# Patient Record
Sex: Male | Born: 2000 | Race: White | Hispanic: Yes | Marital: Single
Health system: Southern US, Community
[De-identification: ages and names within clinical notes are randomized; demographics above are authoritative.]

---
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
## 2020-07-31 IMAGING — US US SCROTUM W/ DOPPLER COMPLETE
1 series · 14 of 25 positions shown · non-contrast
Comparison: None.

CLINICAL DATA: Left-sided scrotal mass for 3 months.

EXAM:
SCROTAL ULTRASOUND
DOPPLER ULTRASOUND OF THE TESTICLES
TECHNIQUE: Complete ultrasound examination of the testicles, epididymis, and
other scrotal structures was performed. Color and spectral Doppler
ultrasound were also utilized to evaluate blood flow to the
testicles.

[Series 1: us scrotum w/ doppler complete · 14 of 66 slices shown]
[im 1/66]
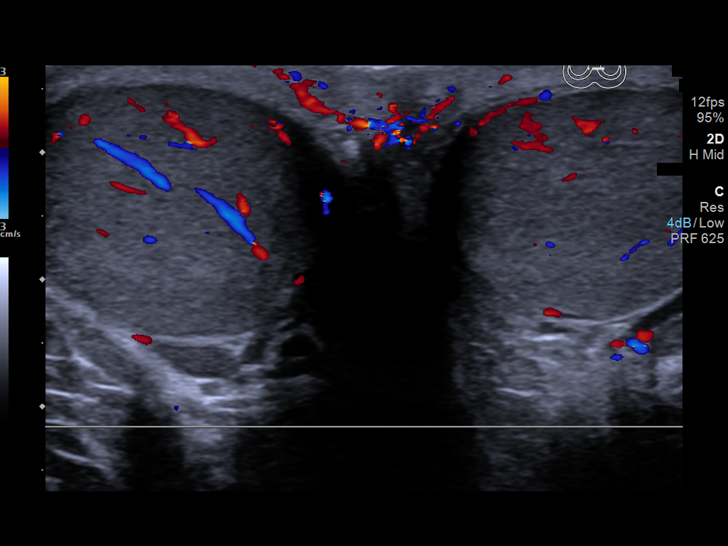
[im 6/66]
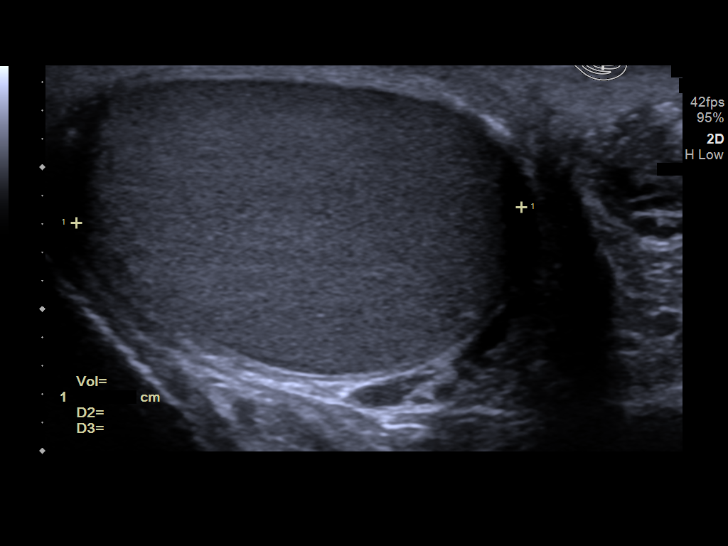
[im 11/66]
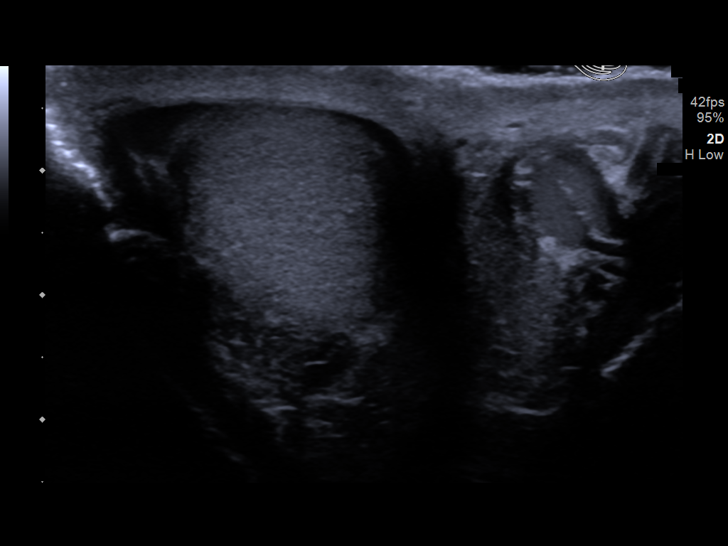
[im 17/66]
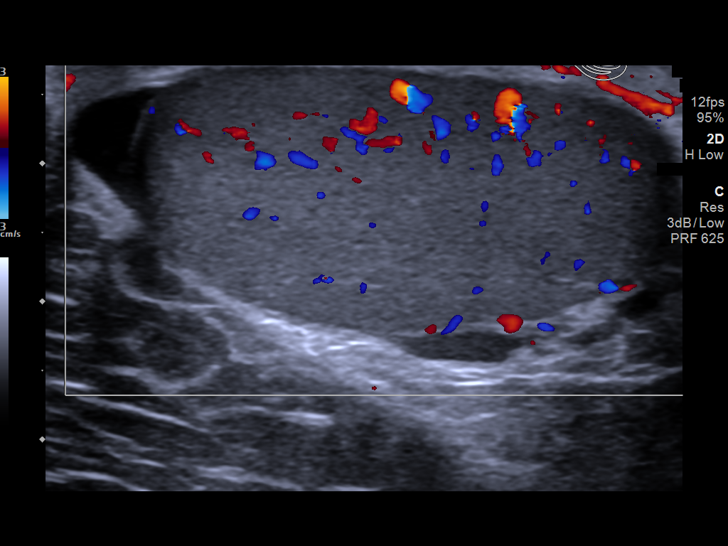
[im 22/66]
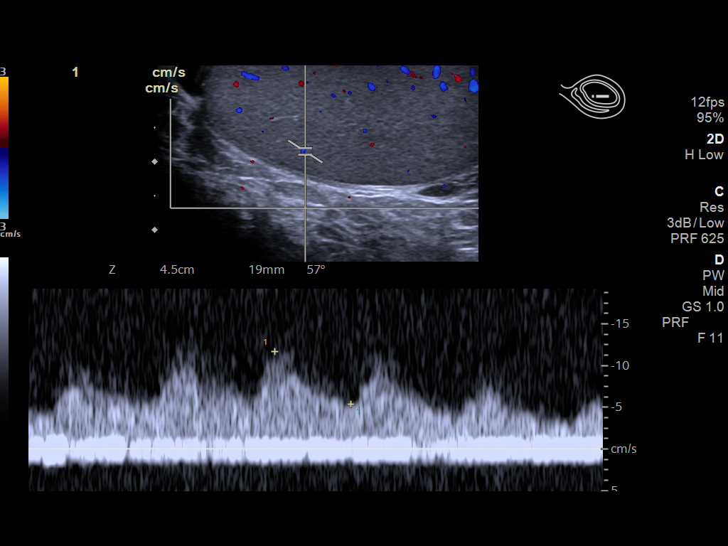
[im 25/66]
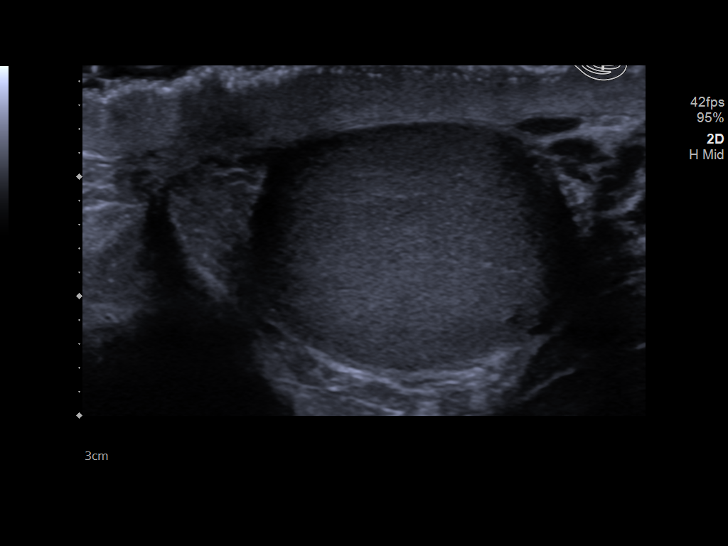
[im 30/66]
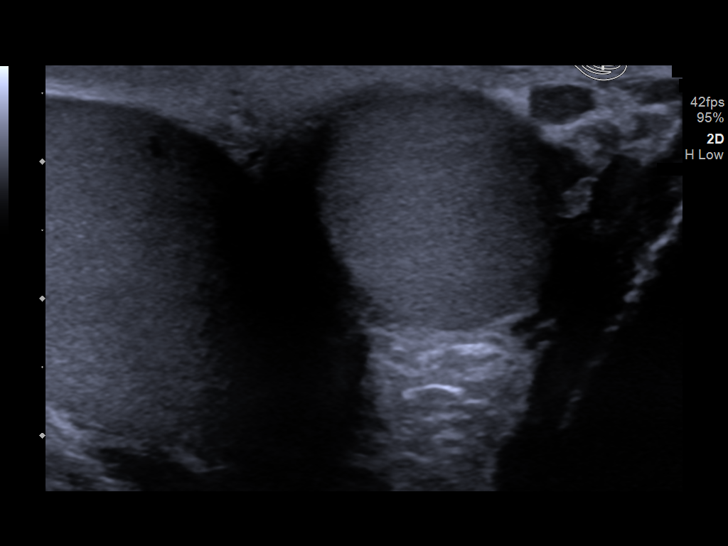
[im 36/66]
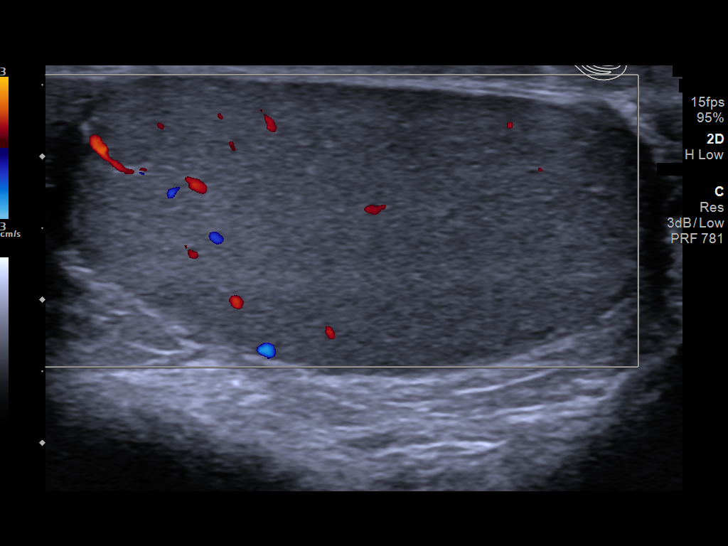
[im 41/66]
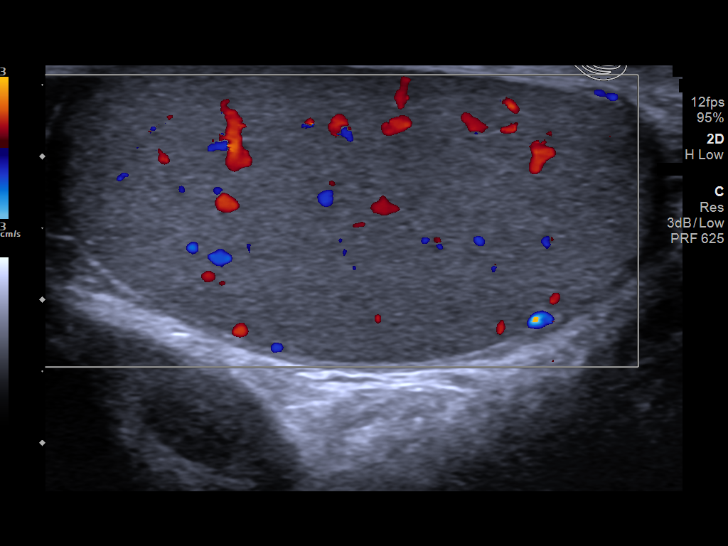
[im 44/66]
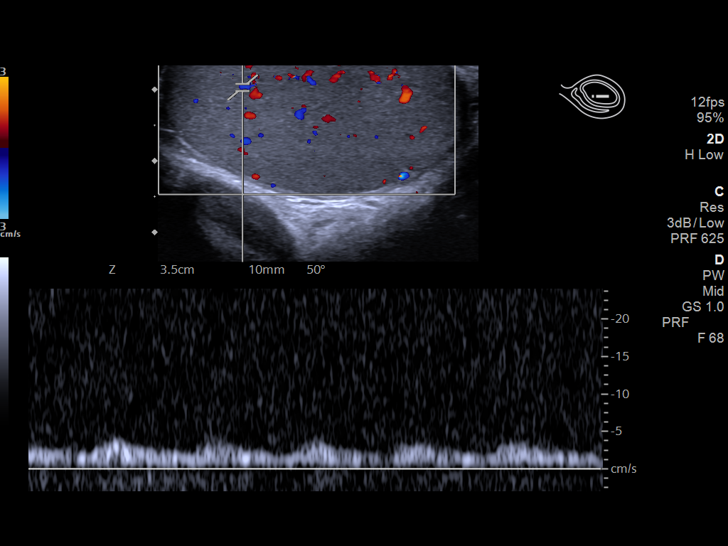
[im 49/66]
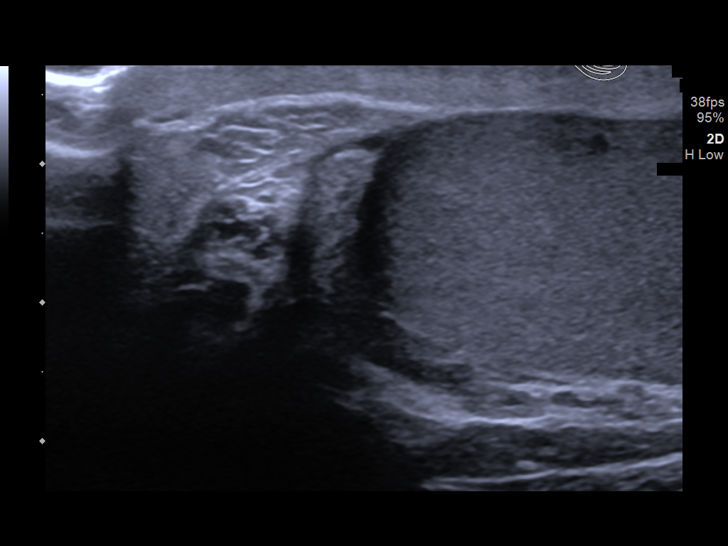
[im 55/66]
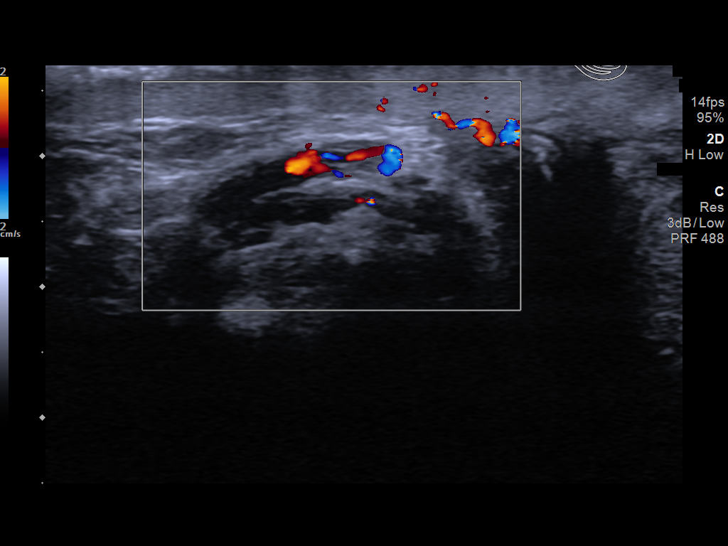
[im 60/66]
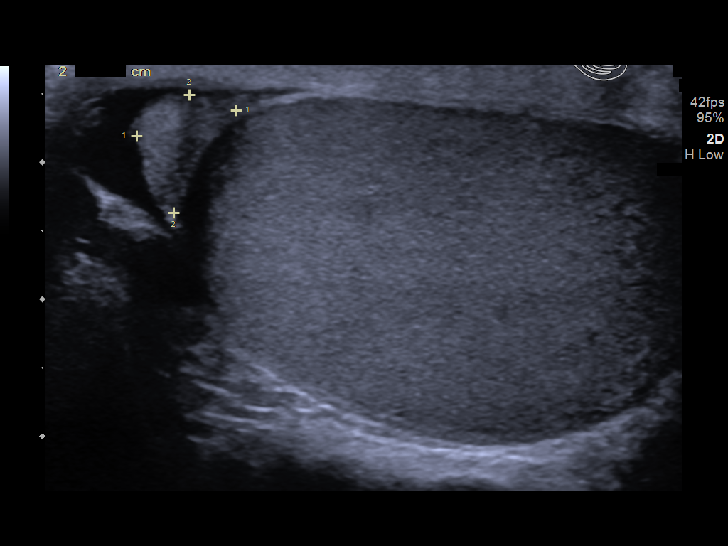
[im 66/66]
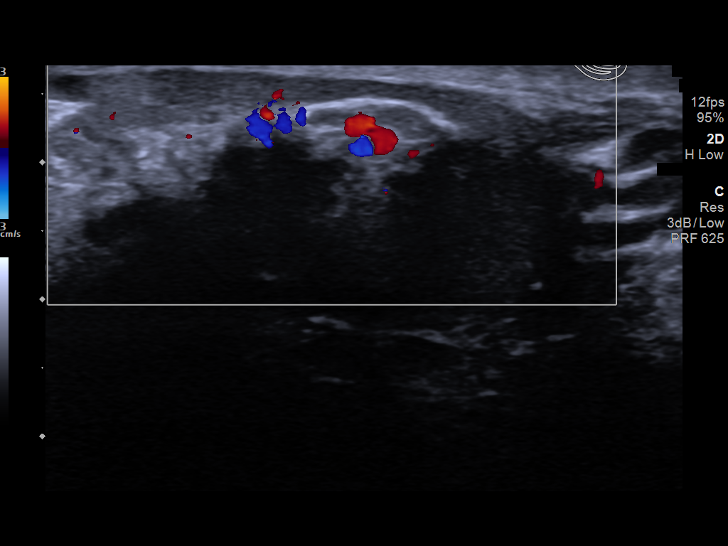

[14 of 25 positions shown; findings below may reference images not displayed]

FINDINGS: Right testicle

Measurements: 43 x 31 x 20 mm.  No mass or abnormal vascularity.

Left testicle

Measurements:  42 x 22 x 28 mm.  No mass or abnormal vascularity.

Right epididymis:  Normal in size and appearance.

Left epididymis:  Normal in size and appearance.

Hydrocele:  None visualized.

Varicocele:  Present on the left with veins dilating to 4 mm.

Pulsed Doppler interrogation of both testes demonstrates normal low
resistance arterial and venous waveforms bilaterally.
IMPRESSION: 1. Left varicocele.
2. Negative for testicular mass.

## 2021-02-18 DIAGNOSIS — Z131 Encounter for screening for diabetes mellitus: Secondary | ICD-10-CM | POA: Diagnosis not present

## 2021-02-18 DIAGNOSIS — L738 Other specified follicular disorders: Secondary | ICD-10-CM | POA: Diagnosis not present

## 2021-02-18 DIAGNOSIS — Z13220 Encounter for screening for lipoid disorders: Secondary | ICD-10-CM | POA: Diagnosis not present

## 2021-02-18 DIAGNOSIS — Z0000 Encounter for general adult medical examination without abnormal findings: Secondary | ICD-10-CM | POA: Diagnosis not present

## 2022-07-09 DIAGNOSIS — E559 Vitamin D deficiency, unspecified: Secondary | ICD-10-CM | POA: Diagnosis not present

## 2022-07-09 DIAGNOSIS — Z0000 Encounter for general adult medical examination without abnormal findings: Secondary | ICD-10-CM | POA: Diagnosis not present

## 2022-07-09 DIAGNOSIS — Z0130 Encounter for examination of blood pressure without abnormal findings: Secondary | ICD-10-CM | POA: Diagnosis not present

## 2022-07-09 DIAGNOSIS — Z1159 Encounter for screening for other viral diseases: Secondary | ICD-10-CM | POA: Diagnosis not present

## 2022-07-09 DIAGNOSIS — F32A Depression, unspecified: Secondary | ICD-10-CM | POA: Diagnosis not present

## 2022-07-09 DIAGNOSIS — Z136 Encounter for screening for cardiovascular disorders: Secondary | ICD-10-CM | POA: Diagnosis not present

## 2022-07-09 DIAGNOSIS — F419 Anxiety disorder, unspecified: Secondary | ICD-10-CM | POA: Diagnosis not present

## 2022-07-09 DIAGNOSIS — Z6834 Body mass index (BMI) 34.0-34.9, adult: Secondary | ICD-10-CM | POA: Diagnosis not present

## 2022-07-09 DIAGNOSIS — R42 Dizziness and giddiness: Secondary | ICD-10-CM | POA: Diagnosis not present

## 2022-07-31 DIAGNOSIS — R42 Dizziness and giddiness: Secondary | ICD-10-CM | POA: Diagnosis not present

## 2022-07-31 DIAGNOSIS — Z0000 Encounter for general adult medical examination without abnormal findings: Secondary | ICD-10-CM | POA: Diagnosis not present

## 2022-07-31 DIAGNOSIS — F32A Depression, unspecified: Secondary | ICD-10-CM | POA: Diagnosis not present

## 2022-07-31 DIAGNOSIS — Z6834 Body mass index (BMI) 34.0-34.9, adult: Secondary | ICD-10-CM | POA: Diagnosis not present

## 2022-07-31 DIAGNOSIS — R030 Elevated blood-pressure reading, without diagnosis of hypertension: Secondary | ICD-10-CM | POA: Diagnosis not present

## 2022-07-31 DIAGNOSIS — K2100 Gastro-esophageal reflux disease with esophagitis, without bleeding: Secondary | ICD-10-CM | POA: Diagnosis not present

## 2022-07-31 DIAGNOSIS — R7303 Prediabetes: Secondary | ICD-10-CM | POA: Diagnosis not present

## 2022-07-31 DIAGNOSIS — E559 Vitamin D deficiency, unspecified: Secondary | ICD-10-CM | POA: Diagnosis not present

## 2022-07-31 DIAGNOSIS — F419 Anxiety disorder, unspecified: Secondary | ICD-10-CM | POA: Diagnosis not present

## 2022-10-02 DIAGNOSIS — R7303 Prediabetes: Secondary | ICD-10-CM | POA: Diagnosis not present

## 2022-10-02 DIAGNOSIS — E559 Vitamin D deficiency, unspecified: Secondary | ICD-10-CM | POA: Diagnosis not present

## 2022-10-02 DIAGNOSIS — Z6834 Body mass index (BMI) 34.0-34.9, adult: Secondary | ICD-10-CM | POA: Diagnosis not present

## 2022-10-02 DIAGNOSIS — L738 Other specified follicular disorders: Secondary | ICD-10-CM | POA: Diagnosis not present

## 2022-10-02 DIAGNOSIS — E6609 Other obesity due to excess calories: Secondary | ICD-10-CM | POA: Diagnosis not present

## 2022-10-02 DIAGNOSIS — Z789 Other specified health status: Secondary | ICD-10-CM | POA: Diagnosis not present

## 2022-10-02 DIAGNOSIS — Z0130 Encounter for examination of blood pressure without abnormal findings: Secondary | ICD-10-CM | POA: Diagnosis not present

## 2022-11-13 DIAGNOSIS — L738 Other specified follicular disorders: Secondary | ICD-10-CM | POA: Diagnosis not present

## 2022-11-13 DIAGNOSIS — R7401 Elevation of levels of liver transaminase levels: Secondary | ICD-10-CM | POA: Diagnosis not present

## 2022-11-13 DIAGNOSIS — Z789 Other specified health status: Secondary | ICD-10-CM | POA: Diagnosis not present

## 2022-11-13 DIAGNOSIS — Z6835 Body mass index (BMI) 35.0-35.9, adult: Secondary | ICD-10-CM | POA: Diagnosis not present

## 2022-11-13 DIAGNOSIS — E6609 Other obesity due to excess calories: Secondary | ICD-10-CM | POA: Diagnosis not present

## 2022-11-13 DIAGNOSIS — R7303 Prediabetes: Secondary | ICD-10-CM | POA: Diagnosis not present

## 2022-11-13 DIAGNOSIS — Z0130 Encounter for examination of blood pressure without abnormal findings: Secondary | ICD-10-CM | POA: Diagnosis not present
